# Patient Record
Sex: Female | Born: 1980 | Race: White | Hispanic: No | Marital: Single
Health system: Southern US, Community
[De-identification: ages and names within clinical notes are randomized; demographics above are authoritative.]

---

## 2003-08-20 ENCOUNTER — Encounter: Payer: Self-pay | Admitting: Neurosurgery

## 2003-08-20 ENCOUNTER — Ambulatory Visit (HOSPITAL_COMMUNITY): Admission: RE | Admit: 2003-08-20 | Discharge: 2003-08-20 | Payer: Self-pay | Admitting: Neurosurgery

## 2003-10-29 ENCOUNTER — Inpatient Hospital Stay (HOSPITAL_COMMUNITY): Admission: RE | Admit: 2003-10-29 | Discharge: 2003-11-03 | Payer: Self-pay | Admitting: Neurosurgery

## 2004-04-14 ENCOUNTER — Ambulatory Visit: Admission: RE | Admit: 2004-04-14 | Discharge: 2004-04-14 | Payer: Self-pay | Admitting: Internal Medicine

## 2004-05-10 ENCOUNTER — Ambulatory Visit (HOSPITAL_COMMUNITY): Admission: RE | Admit: 2004-05-10 | Discharge: 2004-05-11 | Payer: Self-pay | Admitting: Internal Medicine

## 2004-08-24 ENCOUNTER — Other Ambulatory Visit: Admission: RE | Admit: 2004-08-24 | Discharge: 2004-08-24 | Payer: Self-pay | Admitting: Obstetrics and Gynecology

## 2004-12-01 ENCOUNTER — Other Ambulatory Visit: Admission: RE | Admit: 2004-12-01 | Discharge: 2004-12-01 | Payer: Self-pay | Admitting: Obstetrics and Gynecology

## 2005-01-12 ENCOUNTER — Emergency Department (HOSPITAL_COMMUNITY): Admission: EM | Admit: 2005-01-12 | Discharge: 2005-01-12 | Payer: Self-pay | Admitting: Emergency Medicine

## 2005-10-09 ENCOUNTER — Other Ambulatory Visit: Admission: RE | Admit: 2005-10-09 | Discharge: 2005-10-09 | Payer: Self-pay | Admitting: Obstetrics and Gynecology

## 2006-04-09 ENCOUNTER — Other Ambulatory Visit: Admission: RE | Admit: 2006-04-09 | Discharge: 2006-04-09 | Payer: Self-pay | Admitting: Obstetrics and Gynecology

## 2006-09-11 ENCOUNTER — Ambulatory Visit (HOSPITAL_COMMUNITY): Admission: RE | Admit: 2006-09-11 | Discharge: 2006-09-11 | Payer: Self-pay | Admitting: Neurosurgery

## 2006-10-22 ENCOUNTER — Inpatient Hospital Stay (HOSPITAL_COMMUNITY): Admission: RE | Admit: 2006-10-22 | Discharge: 2006-10-25 | Payer: Self-pay | Admitting: Neurosurgery

## 2007-07-05 IMAGING — CT CT L SPINE W/O CM
1 series · 12 of 14 positions shown, 15 images · non-contrast
Comparison: none

CLINICAL DATA: Previous fusion L5-S1. Back pain and right leg pain.

[Series 4: l_spine 2.0 b30s st · axial · 0.28mm/px · z∈[-400,-252]mm · 12 of 124 slices shown, 15 images]
[im 10/124  soft-tissue]
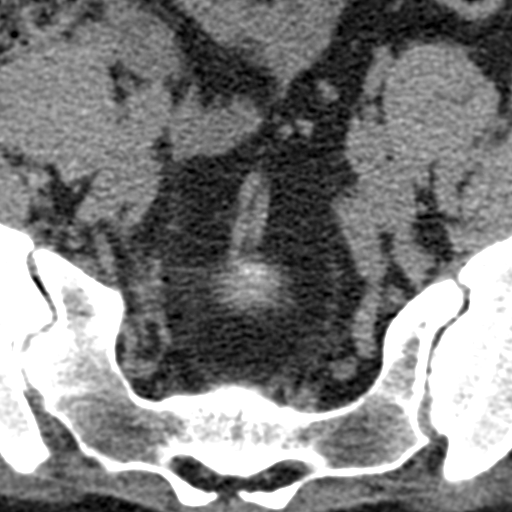
[im 10/124  bone]
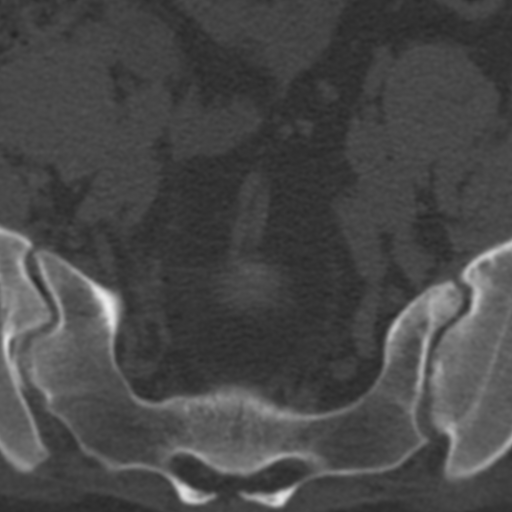
[im 19/124  bone]
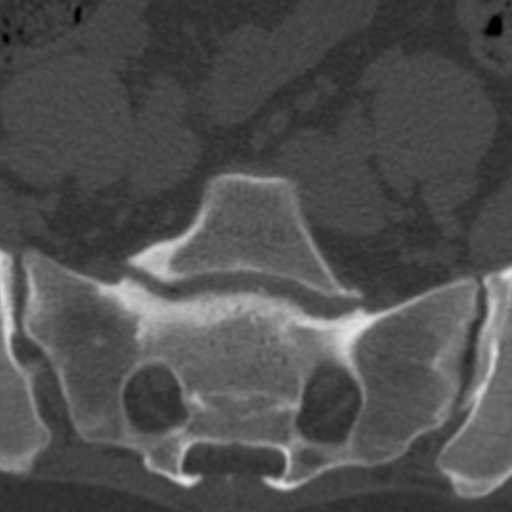
[im 29/124  bone]
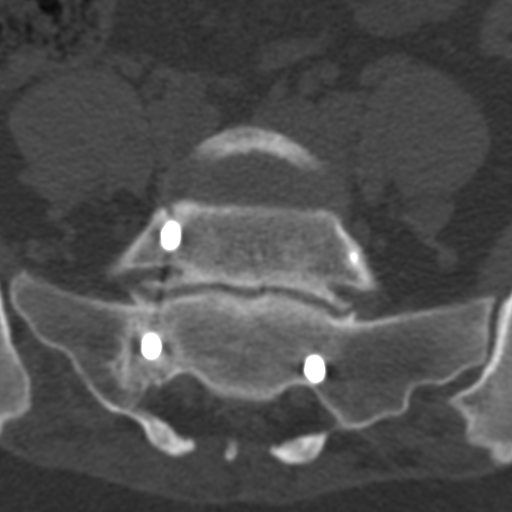
[im 38/124  bone]
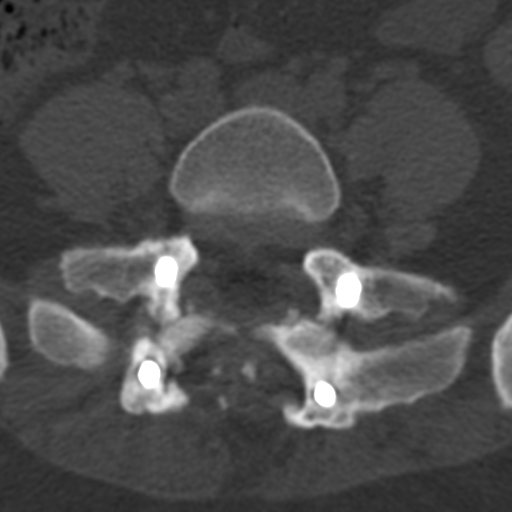
[im 48/124  soft-tissue]
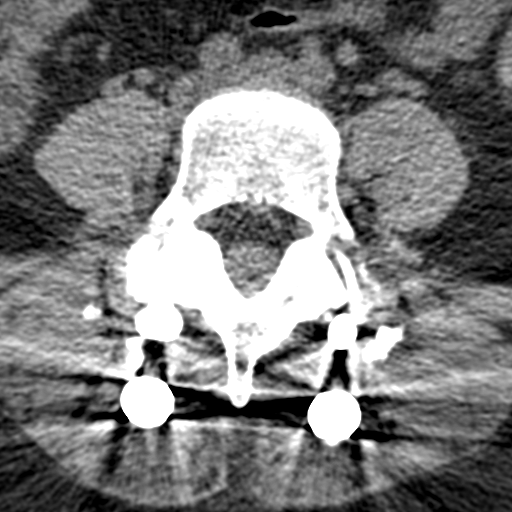
[im 48/124  bone]
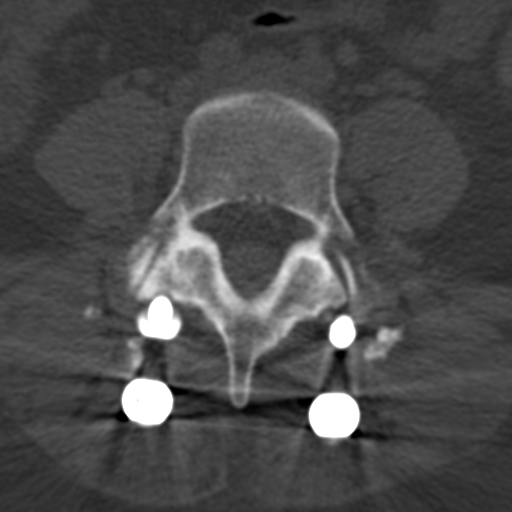
[im 57/124  bone]
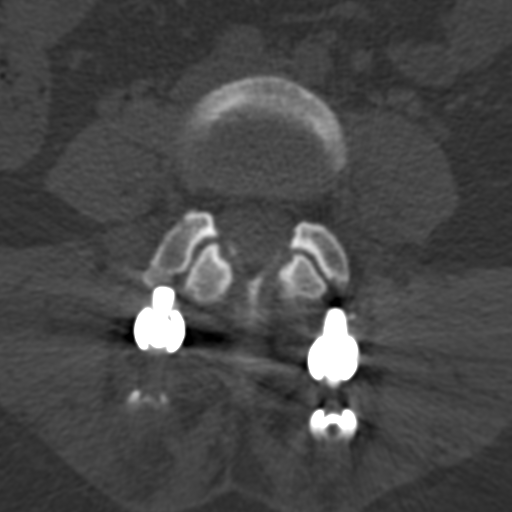
[im 67/124  bone]
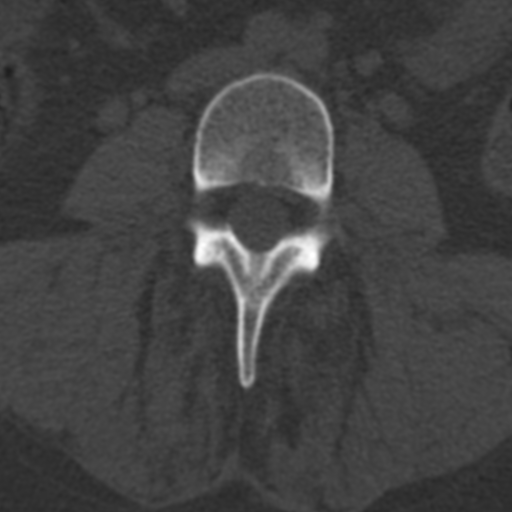
[im 76/124  bone]
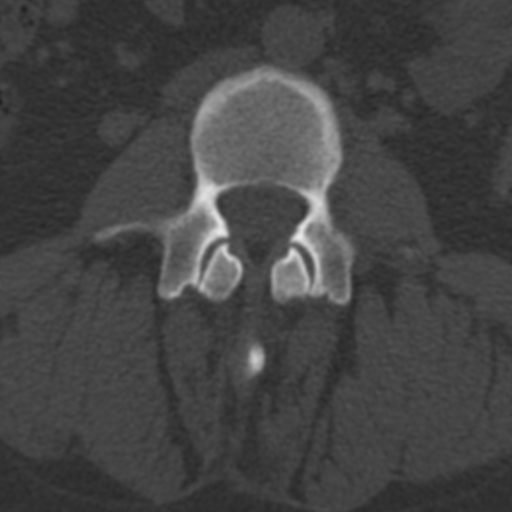
[im 86/124  soft-tissue]
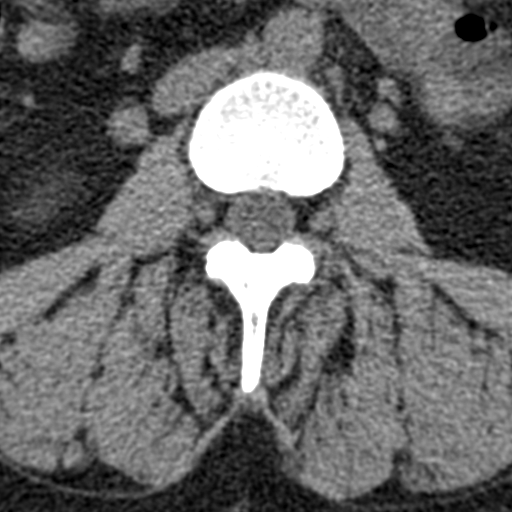
[im 86/124  bone]
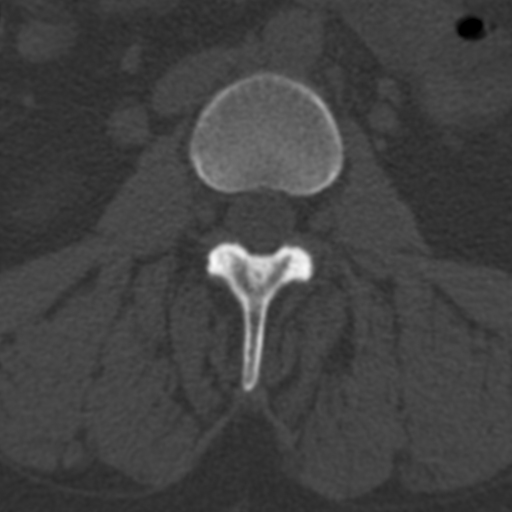
[im 95/124  bone]
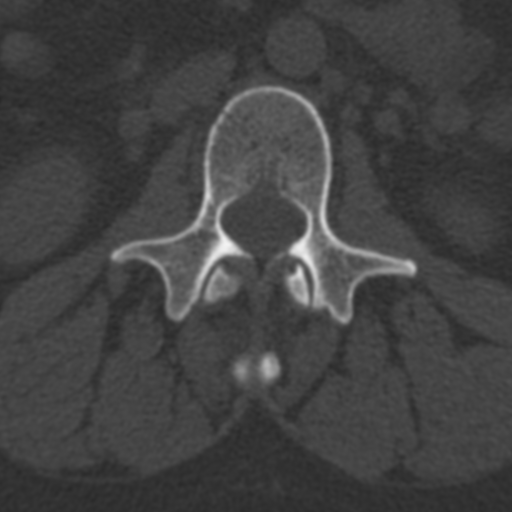
[im 105/124  bone]
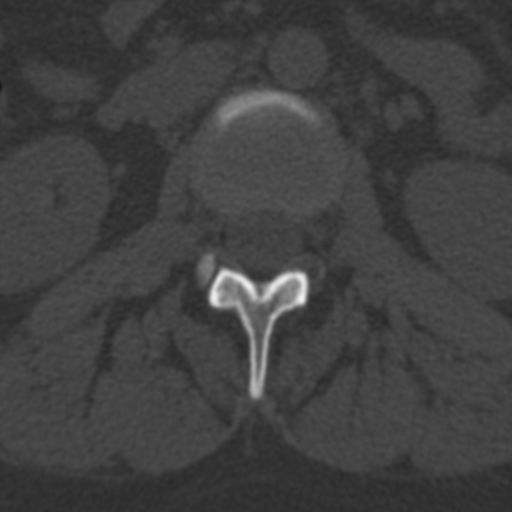
[im 114/124  bone]
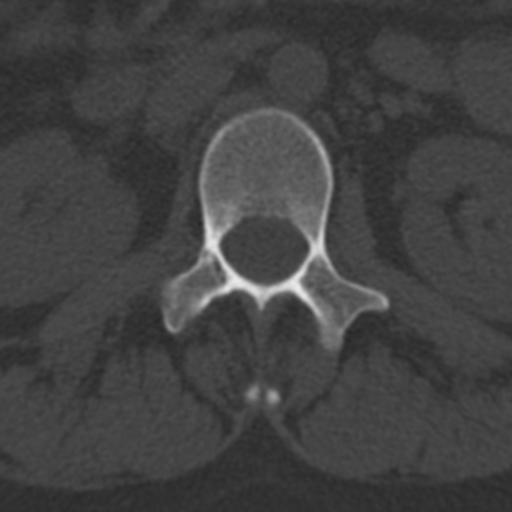

[12 of 14 positions shown; findings below may reference images not displayed]

CT lumbar spine without contrast:

Axial helical CT with coronal and sagittal reconstructions.

L1-L2: Unremarkable

L2-L3: Unremarkable.

L3-L4: Unremarkable

L4-L5: Bilateral facet degenerative hypertrophy with vacuum phenomenon evident
on the right. Mild posterior bulge without compressive pathology. Neural
foramina are widely patent.

L5-S1: Bilateral pedicle screws at L5 and S1. There is grade [DATE] anterolisthesis
of L5 on S1 with narrowing of interspace and vacuum phenomenon evident.
Posterior decompression at L5. There is no bony encroachment on neural foramina.
Hardware intact. No definite   bone bridging across the residual posterior
elements or   the interspace.
IMPRESSION: 1. Changes of posterolateral fusion and decompression L5-S1 with hardware
intact, no definite bone bridging across the posterior elements or interspace.
2. Residual grade [DATE] anterolisthesis L5-S1.
3. Small disc bulge L4-L5 without compressive pathology.

## 2007-08-11 IMAGING — CR DG CHEST 2V
2 series · 2 of 2 positions shown · non-contrast
Comparison: 10/27/03.

CLINICAL DATA: Spondylosis, preop respiratory exam.
 CHEST - 2 VIEW:

[view not recorded (1 of 2)]
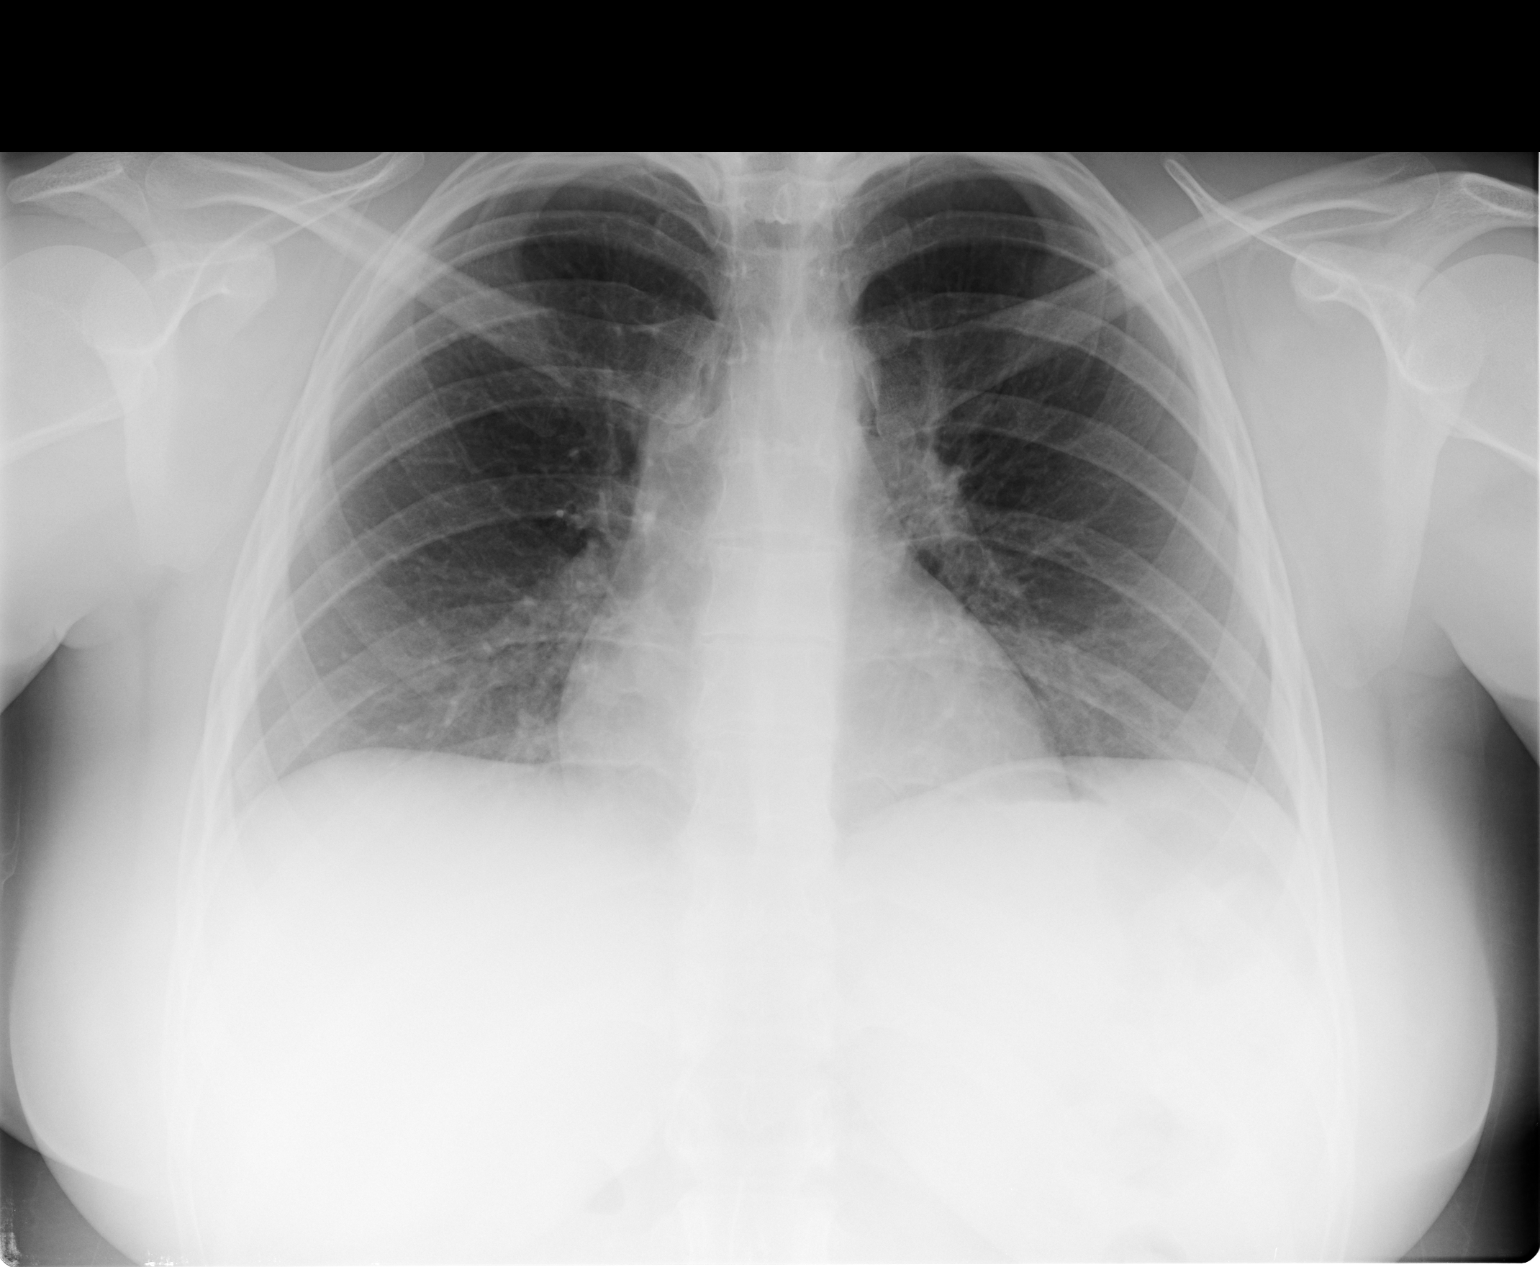

[view not recorded (2 of 2)]
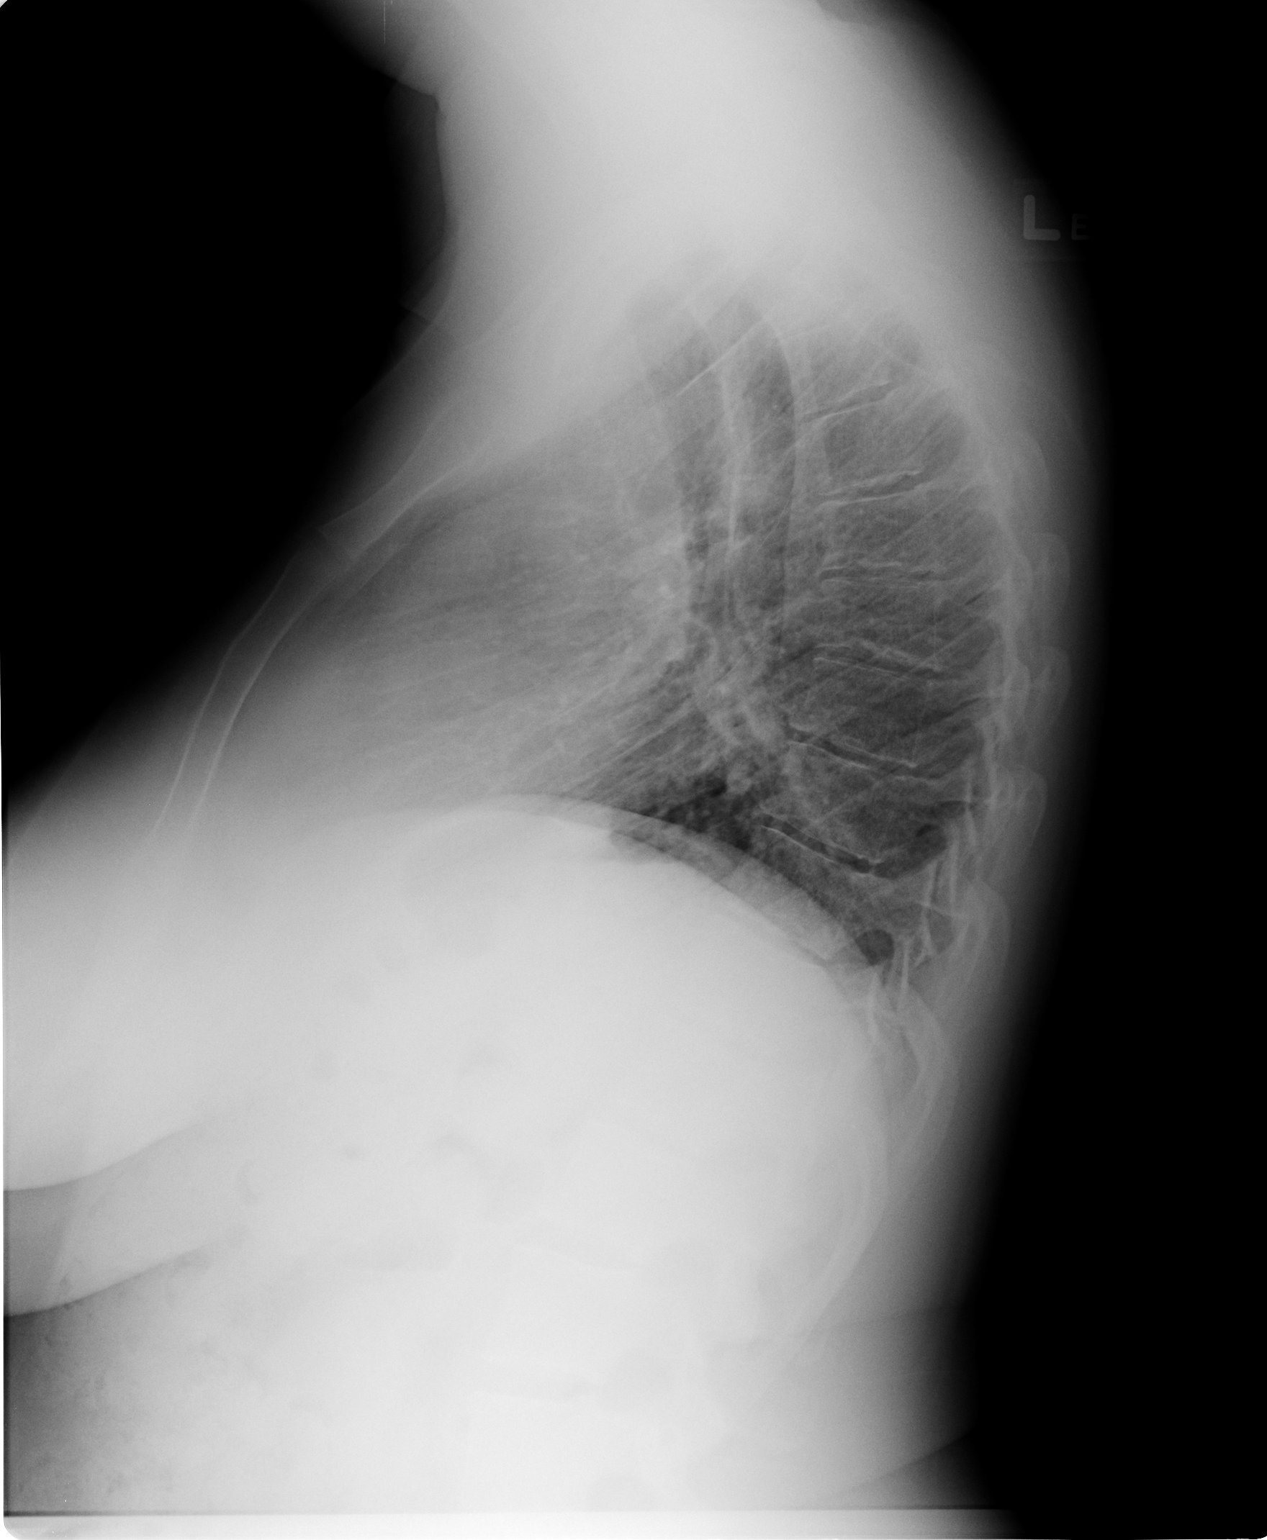

[2 of 2 positions shown; findings below may reference images not displayed]

FINDINGS: Heart size is normal.  Mediastinum is unremarkable.  Lungs are clear.  No effusions.  No significant bony finding.
IMPRESSION: No active disease.

## 2011-01-14 ENCOUNTER — Encounter: Payer: Self-pay | Admitting: Diagnostic Radiology

## 2011-01-14 ENCOUNTER — Encounter: Payer: Self-pay | Admitting: Neurosurgery

## 2011-01-14 ENCOUNTER — Encounter: Payer: Self-pay | Admitting: Psychiatry

## 2015-03-14 ENCOUNTER — Telehealth: Payer: Self-pay | Admitting: Diagnostic Neuroimaging

## 2015-07-19 NOTE — Telephone Encounter (Signed)
ERROR
